# Patient Record
Sex: Male | Born: 1990 | Race: White | Hispanic: No | Marital: Single | Smoking: Current every day smoker
Health system: Southern US, Community
[De-identification: ages and names within clinical notes are randomized; demographics above are authoritative.]

## PROBLEM LIST (undated history)

## (undated) DIAGNOSIS — F32A Depression, unspecified: Secondary | ICD-10-CM

## (undated) DIAGNOSIS — F329 Major depressive disorder, single episode, unspecified: Secondary | ICD-10-CM

## (undated) HISTORY — PX: TONSILLECTOMY: SUR1361

## (undated) HISTORY — PX: WISDOM TOOTH EXTRACTION: SHX21

---

## 2017-12-27 ENCOUNTER — Encounter (HOSPITAL_COMMUNITY): Payer: Self-pay | Admitting: Emergency Medicine

## 2017-12-27 ENCOUNTER — Observation Stay (HOSPITAL_COMMUNITY)
Admission: EM | Admit: 2017-12-27 | Discharge: 2017-12-28 | Disposition: A | Discharge status: Home / Self Care | Payer: Self-pay | Attending: Internal Medicine | Admitting: Internal Medicine

## 2017-12-27 DIAGNOSIS — R55 Syncope and collapse: Secondary | ICD-10-CM | POA: Insufficient documentation

## 2017-12-27 DIAGNOSIS — Z720 Tobacco use: Secondary | ICD-10-CM | POA: Insufficient documentation

## 2017-12-27 DIAGNOSIS — I10 Essential (primary) hypertension: Secondary | ICD-10-CM | POA: Insufficient documentation

## 2017-12-27 DIAGNOSIS — E860 Dehydration: Secondary | ICD-10-CM | POA: Insufficient documentation

## 2017-12-27 DIAGNOSIS — F329 Major depressive disorder, single episode, unspecified: Secondary | ICD-10-CM | POA: Insufficient documentation

## 2017-12-27 DIAGNOSIS — R9431 Abnormal electrocardiogram [ECG] [EKG]: Secondary | ICD-10-CM | POA: Insufficient documentation

## 2017-12-27 DIAGNOSIS — Z79899 Other long term (current) drug therapy: Secondary | ICD-10-CM | POA: Insufficient documentation

## 2017-12-27 DIAGNOSIS — Z8249 Family history of ischemic heart disease and other diseases of the circulatory system: Secondary | ICD-10-CM | POA: Insufficient documentation

## 2017-12-27 DIAGNOSIS — N179 Acute kidney failure, unspecified: Principal | ICD-10-CM | POA: Insufficient documentation

## 2017-12-27 DIAGNOSIS — R000 Tachycardia, unspecified: Secondary | ICD-10-CM | POA: Insufficient documentation

## 2017-12-27 DIAGNOSIS — F419 Anxiety disorder, unspecified: Secondary | ICD-10-CM | POA: Insufficient documentation

## 2017-12-27 HISTORY — DX: Major depressive disorder, single episode, unspecified: F32.9

## 2017-12-27 HISTORY — DX: Depression, unspecified: F32.A

## 2017-12-27 LAB — BASIC METABOLIC PANEL
Anion gap: 18 — ABNORMAL HIGH (ref 5–15)
BUN: 24 mg/dL — ABNORMAL HIGH (ref 6–20)
CHLORIDE: 90 mmol/L — AB (ref 98–111)
CO2: 24 mmol/L (ref 22–32)
CREATININE: 3.34 mg/dL — AB (ref 0.61–1.24)
Calcium: 11 mg/dL — ABNORMAL HIGH (ref 8.9–10.3)
GFR calc non Af Amer: 24 mL/min — ABNORMAL LOW (ref >60)
GFR, EST AFRICAN AMERICAN: 27 mL/min — AB (ref >60)
Glucose, Bld: 93 mg/dL (ref 70–99)
Potassium: 3.6 mmol/L (ref 3.5–5.1)
Sodium: 132 mmol/L — ABNORMAL LOW (ref 135–145)

## 2017-12-27 LAB — URINALYSIS, ROUTINE W REFLEX MICROSCOPIC
Bilirubin Urine: NEGATIVE
GLUCOSE, UA: NEGATIVE mg/dL
Hgb urine dipstick: NEGATIVE
Ketones, ur: NEGATIVE mg/dL
LEUKOCYTES UA: NEGATIVE
Nitrite: NEGATIVE
Protein, ur: NEGATIVE mg/dL
SPECIFIC GRAVITY, URINE: 1.016 (ref 1.005–1.030)
pH: 5 (ref 5.0–8.0)

## 2017-12-27 LAB — RAPID URINE DRUG SCREEN, HOSP PERFORMED
AMPHETAMINES: NOT DETECTED
BENZODIAZEPINES: NOT DETECTED
Barbiturates: NOT DETECTED
Cocaine: NOT DETECTED
OPIATES: NOT DETECTED
TETRAHYDROCANNABINOL: POSITIVE — AB

## 2017-12-27 LAB — CBC
HEMATOCRIT: 51.5 % (ref 39.0–52.0)
Hemoglobin: 17.9 g/dL — ABNORMAL HIGH (ref 13.0–17.0)
MCH: 31.8 pg (ref 26.0–34.0)
MCHC: 34.8 g/dL (ref 30.0–36.0)
MCV: 91.5 fL (ref 80.0–100.0)
NRBC: 0 % (ref 0.0–0.2)
Platelets: 328 10*3/uL (ref 150–400)
RBC: 5.63 MIL/uL (ref 4.22–5.81)
RDW: 11.7 % (ref 11.5–15.5)
WBC: 13.7 10*3/uL — ABNORMAL HIGH (ref 4.0–10.5)

## 2017-12-27 LAB — D-DIMER, QUANTITATIVE: D-Dimer, Quant: <0.27 ug/mL-FEU (ref 0.00–0.50)

## 2017-12-27 LAB — I-STAT TROPONIN, ED: Troponin i, poc: 0 ng/mL (ref 0.00–0.08)

## 2017-12-27 MED ORDER — SODIUM CHLORIDE 0.9 % IV BOLUS
1000.0000 mL | Freq: Once | INTRAVENOUS | Status: AC
  Administered 2017-12-27: 1000 mL via INTRAVENOUS

## 2017-12-27 MED ORDER — POLYETHYLENE GLYCOL 3350 17 G PO PACK: 17.0000 g | PACK | Freq: Every day | ORAL | Status: DC | PRN

## 2017-12-27 MED ORDER — ACETAMINOPHEN 650 MG RE SUPP: 650.0000 mg | Freq: Four times a day (QID) | RECTAL | Status: DC | PRN

## 2017-12-27 MED ORDER — ACETAMINOPHEN 325 MG PO TABS
650.0000 mg | ORAL_TABLET | Freq: Four times a day (QID) | ORAL | Status: DC | PRN
  Administered 2017-12-28: 650 mg via ORAL
  Filled 2017-12-27: qty 2

## 2017-12-27 MED ORDER — SODIUM CHLORIDE 0.9 % IV SOLN
INTRAVENOUS | Status: DC
  Administered 2017-12-28: 02:00:00 via INTRAVENOUS

## 2017-12-27 MED ORDER — ENOXAPARIN SODIUM 40 MG/0.4ML ~~LOC~~ SOLN
40.0000 mg | Freq: Every day | SUBCUTANEOUS | Status: DC
  Administered 2017-12-28: 40 mg via SUBCUTANEOUS
  Filled 2017-12-27: qty 0.4

## 2017-12-27 NOTE — ED Provider Notes (Signed)
[facility] EMERGENCY DEPARTMENT Provider Note   CSN: [id number] Arrival date & time: 12/27/17  1913     History   Chief Complaint Chief Complaint  Patient presents with  . Dizziness  . Near Syncope    HPI Walter Madden is a 27 y.o. male.  26 year old male with a history of depression presents to the emergency department for evaluation of near syncope.  He states that he was at work at 1715 this evening when he started to experience blurry vision with flashing white light.  He states that he began to feel lightheaded and went pale.  Other bystanders at work noticed that he did not appear to be feeling well and assisted the patient to a seated position.  He states that he began experiencing palpitations as though his heart was beating faster.  He heard his heartbeat in his ears and developed a pain to his anterior superior right chest radiating to his bilateral shoulder blades and across his neck.  Notes some shortness of breath as well.  States that he felt "dazed" and slowed.  Symptoms lasted for approximately 45 minutes before spontaneously improving.  He states that he feels fatigued at this time.  Reports history of hypertension at his outpatient primary office.  Was supposed to follow-up for blood pressure recheck and potential initiation of antihypertensive medications, but neglected to go to this appointment.  Does have a strong family history of hypertension.  Notes that he experienced a similar episode 1.5 weeks ago where he took his blood pressure and it was elevated.  Took a metoprolol, belonging to his mother, at this time.  Has also been taking his father's losartan for the past 3 days.  Last use of losartan was at 1600 tonight.  Patient states that he remains well-hydrated during the day, drinking approximately 1.5 gallons of water.  He does have approximately 3-4 beers daily.  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  Denies any leg swelling, prolonged travel, recent surgeries or hospitalizations, syncope, fevers, recent URI symptoms, vomiting, hemoptysis.  No FHx of ACS or DVT/PE.     Past Medical History:  Diagnosis Date  . Depressed     There are no active problems to display for this patient.   Past Surgical History:  Procedure Laterality Date  . TONSILLECTOMY    . WISDOM TOOTH EXTRACTION          Home Medications    Prior to Admission medications   Not on File    Family History History reviewed. No pertinent family history.  Social History Social History   Tobacco Use  . Smoking status: Current Every Day Smoker    Packs/day: 0.50    Types: Cigarettes  Substance Use Topics  . Alcohol use: Yes    Comment: daily  . Drug use: Never     Allergies   Patient has no known allergies.   Review of Systems Review of Systems Ten systems reviewed and are negative for acute change, except as noted in the HPI.    Physical Exam Updated Vital Signs BP 120/83   Pulse 97   Temp 98.6 F (37 C) (Oral)   Resp 14   Ht 5\' 8"  (1.727 m)   Wt 72.6 kg   SpO2 100%   BMI 24.33 kg/m   Physical Exam  Constitutional: He is oriented to person, place, and time. He appears well-developed and well-nourished. No distress.  Nontoxic appearing and in NAD  HENT:  Head: Normocephalic and atraumatic.  Eyes: Conjunctivae and EOM are normal. No scleral icterus.  Neck: Normal range of motion.  Cardiovascular: Regular rhythm and intact distal pulses.  Tachycardia   Pulmonary/Chest: Effort normal. No stridor. No respiratory distress. He has no wheezes. He has no rales.  Respirations even and unlabored  Abdominal: Soft. He exhibits no distension and no mass. There is no tenderness. There is no guarding.  Soft, nontender abdomen  Musculoskeletal: Normal range of motion.  No BLE swelling  Neurological: He is alert and oriented to person, place, and time. He exhibits normal  muscle tone. Coordination normal.  Skin: Skin is warm and dry. No rash noted. He is not diaphoretic. No erythema. No pallor.  Psychiatric: He has a normal mood and affect. His behavior is normal.  Nursing note and vitals reviewed.    ED Treatments / Results  Labs (all labs ordered are listed, but only abnormal results are displayed) Labs Reviewed  BASIC METABOLIC PANEL - Abnormal; Notable for the following components:      Result Value   Sodium 132 (*)    Chloride 90 (*)    BUN 24 (*)    Creatinine, Ser 3.34 (*)    Calcium 11.0 (*)    GFR calc non Af Amer 24 (*)    GFR calc Af Amer 27 (*)    Anion gap 18 (*)    All other components within normal limits  CBC - Abnormal; Notable for the following components:   WBC 13.7 (*)    Hemoglobin 17.9 (*)    All other components within normal limits  URINALYSIS, ROUTINE W REFLEX MICROSCOPIC - Abnormal; Notable for the following components:   APPearance HAZY (*)    All other components within normal limits  RAPID URINE DRUG SCREEN, HOSP PERFORMED - Abnormal; Notable for the following components:   Tetrahydrocannabinol POSITIVE (*)    All other components within normal limits  D-DIMER, QUANTITATIVE (NOT AT [facility])  I-STAT TROPONIN, ED    EKG EKG Interpretation  Date/Time:  Saturday December 27 2017 19:17:48 EDT Ventricular Rate:  116 PR Interval:  116 QRS Duration: 86 QT Interval:  324 QTC Calculation: 450 R Axis:   71 Text Interpretation:  Sinus tachycardia Right atrial enlargement ST & T wave abnormality, consider inferior ischemia Abnormal ECG underlying tremor making interpretation difficult Confirmed by Meridee Score [id number]) on 12/27/2017 11:03:35 PM   Radiology No results found.  Procedures Procedures (including critical care time)  Medications Ordered in ED Medications  sodium chloride 0.9 % bolus 1,000 mL (has no administration in time range)  sodium chloride 0.9 % bolus 1,000 mL (1,000 mLs Intravenous New Bag/Given  12/27/17 2233)     Initial Impression / Assessment and Plan / ED Course  I have reviewed the triage vital signs and the nursing notes.  Pertinent labs & imaging results that were available during my care of the patient were reviewed by me and considered in my medical decision making (see chart for details).  Clinical Course as of Dec 27 2332  Sat Dec 27, 2017  [id number] 27 year old male here with ongoing dizziness lightheadedness.  And like he has been self treating with some medications.  His exam is benign along with fairly unremarkable vital signs.  Unfortunate with lab work he is got a new AKI.  He will need to be admitted to the hospital for further work-up of this.   [MB]    Clinical  Course User Index [MB] Terrilee Files, MD    27 year old male presents the emergency department for evaluation of near syncope.  He is noted to be tachycardic on arrival.  This has improved with IV fluids.  Suspect near syncope to be secondary to hypovolemia.  Dehydration also favored given creatinine of 3.34.  He will require admission for acute kidney injury and ongoing rehydration.  Case discussed with internal medicine residency service who will admit for unassigned.   Final Clinical Impressions(s) / ED Diagnoses   Final diagnoses:  Near syncope  AKI (acute kidney injury) [facility])    ED Discharge Orders    None       Antony Madura, PA-C 12/27/17 2341    Terrilee Files, MD 12/28/17 1024

## 2017-12-27 NOTE — ED Notes (Signed)
ED Provider at bedside. 

## 2017-12-27 NOTE — ED Triage Notes (Signed)
Pt presents with several episodes of near-syncope while at work, feeling lightheaded, seeing stars or like a cop has a flash light in his face; pt also reporting palpitations, sob; denies CP, headache or any other focal deficits currently; pts mom at bedside reporting that HTN runs in the family and that she has been giving him her BP medication.Walter Madden

## 2017-12-28 ENCOUNTER — Encounter (HOSPITAL_COMMUNITY): Payer: Self-pay | Admitting: Emergency Medicine

## 2017-12-28 ENCOUNTER — Other Ambulatory Visit: Payer: Self-pay

## 2017-12-28 DIAGNOSIS — Z720 Tobacco use: Secondary | ICD-10-CM

## 2017-12-28 DIAGNOSIS — F329 Major depressive disorder, single episode, unspecified: Secondary | ICD-10-CM

## 2017-12-28 DIAGNOSIS — F419 Anxiety disorder, unspecified: Secondary | ICD-10-CM

## 2017-12-28 DIAGNOSIS — R55 Syncope and collapse: Secondary | ICD-10-CM

## 2017-12-28 DIAGNOSIS — R7989 Other specified abnormal findings of blood chemistry: Secondary | ICD-10-CM

## 2017-12-28 DIAGNOSIS — R030 Elevated blood-pressure reading, without diagnosis of hypertension: Secondary | ICD-10-CM

## 2017-12-28 DIAGNOSIS — N179 Acute kidney failure, unspecified: Secondary | ICD-10-CM

## 2017-12-28 DIAGNOSIS — Z8249 Family history of ischemic heart disease and other diseases of the circulatory system: Secondary | ICD-10-CM

## 2017-12-28 DIAGNOSIS — E860 Dehydration: Secondary | ICD-10-CM

## 2017-12-28 DIAGNOSIS — E871 Hypo-osmolality and hyponatremia: Secondary | ICD-10-CM

## 2017-12-28 DIAGNOSIS — Z79899 Other long term (current) drug therapy: Secondary | ICD-10-CM

## 2017-12-28 LAB — CBC WITH DIFFERENTIAL/PLATELET
ABS IMMATURE GRANULOCYTES: 0.04 10*3/uL (ref 0.00–0.07)
BASOS ABS: 0 10*3/uL (ref 0.0–0.1)
Basophils Relative: 0 %
Eosinophils Absolute: 0 10*3/uL (ref 0.0–0.5)
Eosinophils Relative: 1 %
HCT: 45.7 % (ref 39.0–52.0)
HEMOGLOBIN: 16 g/dL (ref 13.0–17.0)
IMMATURE GRANULOCYTES: 1 %
LYMPHS PCT: 27 %
Lymphs Abs: 1.8 10*3/uL (ref 0.7–4.0)
MCH: 31.3 pg (ref 26.0–34.0)
MCHC: 35 g/dL (ref 30.0–36.0)
MCV: 89.3 fL (ref 80.0–100.0)
MONO ABS: 0.8 10*3/uL (ref 0.1–1.0)
Monocytes Relative: 12 %
NEUTROS ABS: 4 10*3/uL (ref 1.7–7.7)
NEUTROS PCT: 59 %
PLATELETS: 270 10*3/uL (ref 150–400)
RBC: 5.12 MIL/uL (ref 4.22–5.81)
RDW: 11.7 % (ref 11.5–15.5)
WBC: 6.7 10*3/uL (ref 4.0–10.5)
nRBC: 0 % (ref 0.0–0.2)

## 2017-12-28 LAB — BASIC METABOLIC PANEL
ANION GAP: 7 (ref 5–15)
BUN: 17 mg/dL (ref 6–20)
CO2: 29 mmol/L (ref 22–32)
Calcium: 9.7 mg/dL (ref 8.9–10.3)
Chloride: 99 mmol/L (ref 98–111)
Creatinine, Ser: 1.13 mg/dL (ref 0.61–1.24)
GFR calc Af Amer: >60 mL/min (ref >60)
Glucose, Bld: 104 mg/dL — ABNORMAL HIGH (ref 70–99)
POTASSIUM: 3.8 mmol/L (ref 3.5–5.1)
SODIUM: 135 mmol/L (ref 135–145)

## 2017-12-28 LAB — COMPREHENSIVE METABOLIC PANEL
ALBUMIN: 4.3 g/dL (ref 3.5–5.0)
ALT: 29 U/L (ref 0–44)
ANION GAP: 13 (ref 5–15)
AST: 30 U/L (ref 15–41)
Alkaline Phosphatase: 39 U/L (ref 38–126)
BUN: 24 mg/dL — AB (ref 6–20)
CHLORIDE: 98 mmol/L (ref 98–111)
CO2: 25 mmol/L (ref 22–32)
Calcium: 9.8 mg/dL (ref 8.9–10.3)
Creatinine, Ser: 2.01 mg/dL — ABNORMAL HIGH (ref 0.61–1.24)
GFR calc Af Amer: 50 mL/min — ABNORMAL LOW (ref >60)
GFR, EST NON AFRICAN AMERICAN: 43 mL/min — AB (ref >60)
Glucose, Bld: 126 mg/dL — ABNORMAL HIGH (ref 70–99)
POTASSIUM: 2.8 mmol/L — AB (ref 3.5–5.1)
Sodium: 136 mmol/L (ref 135–145)
Total Bilirubin: 0.8 mg/dL (ref 0.3–1.2)
Total Protein: 7.2 g/dL (ref 6.5–8.1)

## 2017-12-28 LAB — GLUCOSE, CAPILLARY: GLUCOSE-CAPILLARY: 102 mg/dL — AB (ref 70–99)

## 2017-12-28 LAB — TSH: TSH: 1.604 u[IU]/mL (ref 0.350–4.500)

## 2017-12-28 LAB — HIV ANTIBODY (ROUTINE TESTING W REFLEX): HIV SCREEN 4TH GENERATION: NONREACTIVE

## 2017-12-28 LAB — MAGNESIUM: Magnesium: 2.2 mg/dL (ref 1.7–2.4)

## 2017-12-28 MED ORDER — LACTATED RINGERS IV BOLUS
1000.0000 mL | Freq: Once | INTRAVENOUS | Status: AC
  Administered 2017-12-28: 1000 mL via INTRAVENOUS

## 2017-12-28 MED ORDER — POTASSIUM CHLORIDE CRYS ER 20 MEQ PO TBCR
40.0000 meq | EXTENDED_RELEASE_TABLET | ORAL | Status: DC
  Administered 2017-12-28: 40 meq via ORAL
  Filled 2017-12-28: qty 2

## 2017-12-28 MED ORDER — POTASSIUM CHLORIDE CRYS ER 20 MEQ PO TBCR: 40.0000 meq | EXTENDED_RELEASE_TABLET | Freq: Two times a day (BID) | ORAL | Status: DC

## 2017-12-28 NOTE — Progress Notes (Signed)
   Subjective: This morning Mr. Jeanty reports that he feels much better. He feels more lucid and his vision changes have resolved. He has been able to walk around without dizziness or feeling like he will faint. He acknowledges that he should not take any more of his parents' medications and that he should follow-up with his PCP about his blood pressures for management. He has no new symptoms or concerns today.   Objective:  Vital signs in last 24 hours: Vitals:   12/27/17 2245 12/27/17 2330 12/28/17 0038 12/28/17 0609  BP: 120/83 131/86 120/81 128/86  Pulse: 97 98 (!) 111 (!) 104  Resp: 14 16 15 12   Temp:      TempSrc:      SpO2: 100% 100% 100% 100%  Weight:      Height:       Physical Exam Constitutional: Well-developed, well-nourished, and in no distress. .  Cardiovascular: Normal rate and regular rhythm. No murmurs, rubs, or gallops. Pulmonary/Chest: Effort normal. Clear to auscultation bilaterally. No wheezes, rales, or rhonchi. Ext: No lower extremity edema. Skin: Warm and dry. No rashes or wounds.  Assessment/Plan:  Active Problems:   AKI (acute kidney injury) (HCC)   Near syncope  Mr. Kalman is a 27 yo male with a medical history of anxiety and depression who presented with near-syncope and prodromal symptoms of lightheadedness, spotted vision, palpitations, and chest tightness in the setting of taking his parents' blood pressure medications (thought to be losartan) for management of his self-diagnosed HTN. He was admitted for AKI and fluid resuscitation.   AKI - Cr elevated to 3.34 upon admission. Baseline is 0.8 one year ago. Likely prerenal AKI in the setting of dehydration and use of blood pressure medications. His AKI and dehydration explain his symptoms including presyncope and confusion - Received 2L of fluid with improvement of Cr to 2.01  - His symptoms also resolved with fluid resuscitation.  - Will check one more Cr. If Cr has downtrended, he will be  medically appropriate for discharge home.  Plan - Bolus 1L LR - Repeat pm BMP  Hypercalcemia:  - Ca elevated to 11 on admission. Improved to 9.8 after fluid resuscitation.  - If his parents' medication was losartan combined with hctz, the hctz could explain his hypercalcemia.  Plan - pm BMP - f/u ionized Ca  Hypertension - Patient has history of mildly elevated blood pressures at past PCP visits. He has not followed up for repeat measurements or treatment initiation. His blood pressures have been normal during the hospitalization. He will f/u with his PCP. Advised the patient that treatment for mild HTN in a young, healthy man such as himself is not emergent. He can safely wait for a PCP appointment without needing to take his parents' medications in the interim.  Plan - f/u with PCP   Dispo: Anticipated discharge today pending BMP improvements  [place]yer, Cathleen Corti, MD 12/28/2017, 6:59 AM Pager: [phone]

## 2017-12-28 NOTE — Discharge Summary (Signed)
Name: Walter Madden MRN: [id number] DOB: 1990/04/04 27 y.o. PCP: Family, [facility]  Date of Admission: 12/27/2017  7:15 PM Date of Discharge: 12/28/17 Attending Physician: Dr. Mikey Bussing  Discharge Diagnosis: 1. AKI 2. Presyncope 3. Hypercalcemia 4. Question of elevated blood pressure  Discharge Medications: Allergies as of 12/28/2017   No Known Allergies     Medication List    TAKE these medications   busPIRone 5 MG tablet Commonly known as:  BUSPAR Take 5 mg by mouth daily.       Disposition and follow-up:   Mr.Walter Madden was discharged from [facility] in Good condition.  At the hospital follow up visit please address:  1.  AKI: Likely prerenal from dehydration and blood pressure medication use. Cr improved from 3.34 on admission to 1.13 with fluid resuscitation.       Question of HTN: Patient was self-treating HTN after home measurements were elevated. Normotensive during admission. Repeat BP for formal diagnosis of HTN and further management.        Hypercalcemia: Improved from 11.0 on admission to 9.7 after fluid resuscitation. Likely in the setting of dehydration, AKI, and medication use. Ionized Ca pending at discharge.  2.  Labs / imaging needed at time of follow-up: None  3.  Pending labs/ test needing follow-up: Ionized Ca  Follow-up Appointments: Follow-up Information    Family, [facility] Follow up in 1 week(s).   Specialty:  Family Medicine Contact information: [facility] [address] [place] [address] [phone]           Hospital Course by problem list: Mr. Walter Madden is a 27 yo male with a medical history of anxiety and depression who presented with near-syncope and prodromal symptoms of lightheadedness, spotted vision, palpitations, and chest tightness in the setting of taking his parents' blood pressure medications for management of his  self-diagnosed HTN. He thought he took losartan, but is unsure. The medication could have included a diuretic like hctz. He was admitted for AKI and fluid resuscitation.   1. AKI: Cr was elevated to 3.34 upon admission. Baseline was 0.8 one year ago. Likely prerenal AKI in the setting of dehydration and use of blood pressure medications. His AKI and dehydration explained his symptoms including presyncope and confusion. He was treated with 3L of IVF. His Cr improved to 1.13 on discharge.  2. Presyncope: Caused by dehydration and AKI. Negative troponin and d-dimer to rule out ACS and PE.  3. Hypercalcemia: Ca elevated to 11 on admission. Improved to 9.7 upon discharge. Ionized Ca pending at time of discharge. Most likely caused by dehydration, AKI, and medication use.  4. Question of elevated blood pressure: Patient has history of mildly elevated blood pressures at past PCP visits. He has not followed up for repeat measurements or treatment initiation. His blood pressures were normal during the hospitalization. He was advised to avoid taking any blood pressure medications until he speaks with his PCP.   Discharge Vitals:   BP 128/86 (BP Location: Right Arm)   Pulse (!) 104   Temp 98.6 F (37 C) (Oral)   Resp 12   Ht 5\' 8"  (1.727 m)   Wt 72.6 kg   SpO2 100%   BMI 24.33 kg/m   Pertinent Labs, Studies, and Procedures:  CBC Latest Ref Rng & Units 12/28/2017 12/27/2017  WBC 4.0 - 10.5 K/uL 6.7 13.7(H)  Hemoglobin 13.0 - 17.0 g/dL 56.3 17.9(H)  Hematocrit 39.0 - 52.0 %  45.7 51.5  Platelets 150 - 400 K/uL 270 328   CMP Latest Ref Rng & Units 12/28/2017 12/28/2017 12/27/2017  Glucose 70 - 99 mg/dL 536(U) 440(H) 93  BUN 6 - 20 mg/dL 17 47(Q) 25(Z)  Creatinine 0.61 - 1.24 mg/dL 5.63 8.75(I) 4.33(I)  Sodium 135 - 145 mmol/L 135 136 132(L)  Potassium 3.5 - 5.1 mmol/L 3.8 2.8(L) 3.6  Chloride 98 - 111 mmol/L 99 98 90(L)  CO2 22 - 32 mmol/L 29 25 24   Calcium 8.9 - 10.3 mg/dL 9.7 9.8 11.0(H)    Total Protein 6.5 - 8.1 g/dL - 7.2 -  Total Bilirubin 0.3 - 1.2 mg/dL - 0.8 -  Alkaline Phos 38 - 126 U/L - 39 -  AST 15 - 41 U/L - 30 -  ALT 0 - 44 U/L - 29 -    Discharge Instructions: Discharge Instructions    Discharge instructions   Complete by:  As directed    It was a pleasure taking care of you, Mr. Walter Madden!  1. You were hospitalized for dehydration and kidney injury. This was most likely caused by not taking in enough water and taking blood pressure medications that were not prescribed to you. Your kidney injury and your symptoms have resolved and you are medically safe for discharge home. Please increase your water intake and avoid taking any blood pressure medications until you speak with your PCP.  2. Your kidney injury has completely resolved  2. Please make an appointment with your PCP to discuss your blood pressure. Your blood pressure was normal while you were in the hospital, but it has been elevated on previous visits with your PCP. In the meantime, there is no emergent need for high blood pressure treatment. Please see a doctor sooner if you have any episodes of chest pain or fainting.  3. Your calcium was mildly elevated. The calcium improved to normal after you received fluids. We have one blood work lab about calcium that has not resulted yet. We will call you if the results are abnormal.   Feel free to call our clinic at [phone] if you have any questions!  Thanks, Dr. Avie Arenas      Signed: Dorrell, Cathleen Corti, MD 12/28/2017, 12:46 PM   Pager: [phone]

## 2017-12-28 NOTE — H&P (Signed)
Date: 12/28/2017               Patient Name:  Walter Madden MRN: [id number]  DOB: [birth year] Age / Sex: 27 y.o., male   PCP: Family, [facility]         Medical Service: Internal Medicine Teaching Service         Attending Physician: Dr. Mikey Bussing, Marthenia Rolling, DO    First Contact: Dr. Avie Arenas Pager:  [phone]  Second Contact: Dr. Mikey Bussing  Pager: [phone]       After Hours (After 5p/  First Contact Pager: [phone]  weekends / holidays): Second Contact Pager: [phone]   Chief Complaint: pre-syncope   History of Present Illness: Walter Madden is a 27 y/o male with PMHx of anxiety and depression who presents to the ED after a near-syncopal episode at work. Around 5:15 this afternoon he began having pro-dromal symptoms of lightheadedness, spotted vision, palpitations and chest tightness that radiated into his neck and back. He never lost consciousness. Patient called his sister to pick him up from work. His parents then brought him to the ED after symptoms had not fully resolved. Episode lasted for approximately 45 minutes.  He notes a similar episode 1.5 weeks ago in which he developed lightheadedness and palpitations. At that time, he took his blood pressure and it was elevated at 150/110. He took one of his mom's Metoprolol that she takes for HTN. Three days ago, he starting taking his dad's anti-hypertensive which family thinks is Losartan 20 mg.  Patient was feeling well until yesterday when he felt lightheaded again. This morning around 4 am he had an episode of non-bloody non-bilious vomiting. Also endorses bilateral hand cramping that began yesterday.  No recent, fevers, chills, abdominal pain, diarrhea, URI symptoms, chest pain, shortness of breath, urinary symptoms, hematuria.   ED work-up: D-dimer and troponin negative; U/A normal; BMP revealed mild hyponatremia and hypochloremia, BUN 24, crt 3.34, calcium 11   Meds: Patient has been on Paxil and Buspirone for anxiety and  depression but does not recall dose No outpatient medications have been marked as taking for the 12/27/17 encounter [facility] Encounter).     Allergies: Allergies as of 12/27/2017  . (No Known Allergies)   Past Medical History:  Diagnosis Date  . Depressed     Family History: + for HTN (mother and father); negative for MI, clotting disorders, kidney disease   Social History: works as Biomedical scientist at [employer]; currently living with his parents; eats fairly healthy and exercises frequently; endorses prior history of EtOH and cocaine abuse but currently drinks 2-3 beers/day (last drink 2 nights ago); occasionally smokes tobacco and/or marijuana   Review of Systems: A complete ROS was negative except as per HPI.   Physical Exam: Blood pressure 120/81, pulse (!) 111, temperature 98.6 F (37 C), temperature source Oral, resp. rate 15, height 5\' 8"  (1.727 m), weight 72.6 kg, SpO2 100 %. General: A&Ox4; pleasant male, sitting up in bed in NAD HEENT: normocephalic, atraumatic, mucous membranes mildly dry Neck: supple CV: Tachycardic rate, regular rhythm; no murmurs, rubs or gallops Pulm: normal respiratory effort; lungs CTA bilaterally Abd: BS+; abdomen soft, non-distended, non-tender MSK: TTP bilateral mid-thoracic paraspinal muscles  Ext: no edema Skin: warm and dry; superficial burn to left forearm Psych: appropriate mood and affect   EKG: personally reviewed my interpretation is sinus tachycardia, otherwise difficult to interpret due to artifact   Assessment & Plan by Problem: Active Problems:   AKI (  acute kidney injury) (HCC)   Near syncope  1. AKI: likely prerenal that is multifactorial in the setting of recent ARB use (possibly in combination with diuretic), dehydration due to episode of emesis and fairly hot and strenuous work environment.  - s/p 2L fluid boluses - will continue IVF at 250 cc/hr x10 hours - CMP in the morning  2. Pre-syncope: may be in the setting  of hypovolemia. Low suspicion for PE given negative d-dimer, and he denies chest pain, shortness of breath, leg pain or swelling. Will monitor for arrhythmia on telemetry and repeat EKG in the morning.   3. Hypercalcemia: most likely in the setting of AKI. Patient is symptomatic with muscle spasms. Continuing IVF as above and repeating labs with ionized calcium. Since he is persistently tachycardic, will also check TSH to rule out hyperthyroidism as etiology for elevated calcium.  If his father's Losartan is in combination with HCTZ, this would also explain his lab findings. His father will confirm which medication he is on.   DVT prophylaxis: Lovenox  Diet: regular  Code: FULL   Dispo: Admit patient to Observation with expected length of stay less than 2 midnights.  SignedBridget Hartshorn, DO 12/28/2017, 1:11 AM  Pager: [phone]

## 2017-12-29 LAB — CALCIUM, IONIZED: Calcium, Ionized, Serum: 5.1 mg/dL (ref 4.5–5.6)

## 2018-01-23 ENCOUNTER — Encounter (HOSPITAL_BASED_OUTPATIENT_CLINIC_OR_DEPARTMENT_OTHER): Payer: Self-pay

## 2018-01-23 ENCOUNTER — Emergency Department (HOSPITAL_BASED_OUTPATIENT_CLINIC_OR_DEPARTMENT_OTHER)
Admission: EM | Admit: 2018-01-23 | Discharge: 2018-01-23 | Disposition: A | Discharge status: Home / Self Care | Payer: Self-pay | Attending: Emergency Medicine | Admitting: Emergency Medicine

## 2018-01-23 ENCOUNTER — Other Ambulatory Visit: Payer: Self-pay

## 2018-01-23 ENCOUNTER — Emergency Department (HOSPITAL_BASED_OUTPATIENT_CLINIC_OR_DEPARTMENT_OTHER): Payer: Self-pay

## 2018-01-23 DIAGNOSIS — Z79899 Other long term (current) drug therapy: Secondary | ICD-10-CM | POA: Insufficient documentation

## 2018-01-23 DIAGNOSIS — R0789 Other chest pain: Secondary | ICD-10-CM | POA: Insufficient documentation

## 2018-01-23 DIAGNOSIS — F17210 Nicotine dependence, cigarettes, uncomplicated: Secondary | ICD-10-CM | POA: Insufficient documentation

## 2018-01-23 DIAGNOSIS — R030 Elevated blood-pressure reading, without diagnosis of hypertension: Secondary | ICD-10-CM | POA: Insufficient documentation

## 2018-01-23 LAB — HEPATIC FUNCTION PANEL
ALBUMIN: 4.3 g/dL (ref 3.5–5.0)
ALK PHOS: 34 U/L — AB (ref 38–126)
ALT: 40 U/L (ref 0–44)
AST: 28 U/L (ref 15–41)
BILIRUBIN INDIRECT: 0.6 mg/dL (ref 0.3–0.9)
BILIRUBIN TOTAL: 0.7 mg/dL (ref 0.3–1.2)
Bilirubin, Direct: 0.1 mg/dL (ref 0.0–0.2)
TOTAL PROTEIN: 7.1 g/dL (ref 6.5–8.1)

## 2018-01-23 LAB — CBC WITH DIFFERENTIAL/PLATELET
Abs Immature Granulocytes: 0.01 10*3/uL (ref 0.00–0.07)
BASOS ABS: 0 10*3/uL (ref 0.0–0.1)
Basophils Relative: 0 %
EOS PCT: 1 %
Eosinophils Absolute: 0 10*3/uL (ref 0.0–0.5)
HCT: 46.6 % (ref 39.0–52.0)
HEMOGLOBIN: 15.7 g/dL (ref 13.0–17.0)
Immature Granulocytes: 0 %
LYMPHS PCT: 21 %
Lymphs Abs: 1.1 10*3/uL (ref 0.7–4.0)
MCH: 31.8 pg (ref 26.0–34.0)
MCHC: 33.7 g/dL (ref 30.0–36.0)
MCV: 94.3 fL (ref 80.0–100.0)
MONOS PCT: 11 %
Monocytes Absolute: 0.6 10*3/uL (ref 0.1–1.0)
NRBC: 0 % (ref 0.0–0.2)
Neutro Abs: 3.3 10*3/uL (ref 1.7–7.7)
Neutrophils Relative %: 67 %
Platelets: 251 10*3/uL (ref 150–400)
RBC: 4.94 MIL/uL (ref 4.22–5.81)
RDW: 11.9 % (ref 11.5–15.5)
WBC: 5 10*3/uL (ref 4.0–10.5)

## 2018-01-23 LAB — BASIC METABOLIC PANEL
Anion gap: 9 (ref 5–15)
BUN: 11 mg/dL (ref 6–20)
CALCIUM: 9.2 mg/dL (ref 8.9–10.3)
CHLORIDE: 101 mmol/L (ref 98–111)
CO2: 26 mmol/L (ref 22–32)
CREATININE: 0.82 mg/dL (ref 0.61–1.24)
GFR calc non Af Amer: >60 mL/min (ref >60)
GLUCOSE: 111 mg/dL — AB (ref 70–99)
Potassium: 4.3 mmol/L (ref 3.5–5.1)
Sodium: 136 mmol/L (ref 135–145)

## 2018-01-23 LAB — LIPASE, BLOOD: Lipase: 30 U/L (ref 11–51)

## 2018-01-23 LAB — TROPONIN I: Troponin I: <0.03 ng/mL (ref <0.03)

## 2018-01-23 MED ORDER — KETOROLAC TROMETHAMINE 30 MG/ML IJ SOLN
30.0000 mg | Freq: Once | INTRAMUSCULAR | Status: AC
  Administered 2018-01-23: 30 mg via INTRAVENOUS
  Filled 2018-01-23: qty 1

## 2018-01-23 NOTE — ED Provider Notes (Signed)
[facility] EMERGENCY DEPARTMENT Provider Note   CSN: [id number] Arrival date & time: 01/23/18  1716     History   Chief Complaint Chief Complaint  Patient presents with  . Chest Pain    HPI Walter Madden is a 27 y.o. male who presents to ED for evaluation of 5-day history of constant, dull pressure-like sensation in the middle of his chest.  Denies any shortness of breath or URI symptoms.  Patient states that his pain began 5 days ago.  He has been checking his blood pressures at home and they have been elevated to the 1 30-1 40 systolic range.  However, he states that he checked his blood pressure this morning and found it to be 160s systolic.  He does note that about 4 weeks ago, he was hospitalized for an AKI after taking several doses of his father's blood pressure and diuretic medication.  He has never been diagnosed with hypertension and has never been on any prescribed antihypertensives.  He has not tried any medications at home to help with his symptoms.  He denies any nausea, vomiting, abdominal pain, fever.  He reports daily alcohol use and occasional tobacco use.  Denies any other drug use.  Denies any leg swelling, recent immobilization, history of MI, DVT or PE.  HPI  Past Medical History:  Diagnosis Date  . Depressed     Patient Active Problem List   Diagnosis Date Noted  . Near syncope   . AKI (acute kidney injury) (HCC) 12/27/2017    Past Surgical History:  Procedure Laterality Date  . TONSILLECTOMY    . WISDOM TOOTH EXTRACTION          Home Medications    Prior to Admission medications   Medication Sig Start Date End Date Taking? Authorizing Provider  busPIRone (BUSPAR) 5 MG tablet Take 5 mg by mouth daily. 08/29/17 08/29/18  [provider]    Family History No family history on file.  Social History Social History   Tobacco Use  . Smoking status: Current Every Day Smoker    Packs/day: 0.50    Types: Cigarettes  .  Smokeless tobacco: Never Used  Substance Use Topics  . Alcohol use: Yes    Comment: daily  . Drug use: Never     Allergies   Patient has no known allergies.   Review of Systems Review of Systems  Constitutional: Negative for appetite change, chills and fever.  HENT: Negative for ear pain, rhinorrhea, sneezing and sore throat.   Eyes: Negative for photophobia and visual disturbance.  Respiratory: Negative for cough, chest tightness, shortness of breath and wheezing.   Cardiovascular: Positive for chest pain. Negative for palpitations.  Gastrointestinal: Negative for abdominal pain, blood in stool, constipation, diarrhea, nausea and vomiting.  Genitourinary: Negative for dysuria, hematuria and urgency.  Musculoskeletal: Negative for myalgias.  Skin: Negative for rash.  Neurological: Negative for dizziness, weakness and light-headedness.     Physical Exam Updated Vital Signs BP (!) 140/98   Pulse 87   Temp 97.9 F (36.6 C) (Oral)   Resp 16   Ht 5\' 7"  (1.702 m)   Wt 75.3 kg   SpO2 100%   BMI 26.00 kg/m   Physical Exam  Constitutional: He appears well-developed and well-nourished. No distress.  HENT:  Head: Normocephalic and atraumatic.  Nose: Nose normal.  Eyes: Conjunctivae and EOM are normal. Left eye exhibits no discharge. No scleral icterus.  Neck: Normal range of motion. Neck supple.  Cardiovascular:  Normal rate, regular rhythm, normal heart sounds and intact distal pulses. Exam reveals no gallop and no friction rub.  No murmur heard. Pulmonary/Chest: Effort normal and breath sounds normal. No respiratory distress.  Abdominal: Soft. Bowel sounds are normal. He exhibits no distension. There is no tenderness. There is no guarding.  Musculoskeletal: Normal range of motion. He exhibits no edema.  No lower extremity edema, erythema or calf tenderness bilaterally. 2+ DP pulses bilaterally.  Neurological: He is alert. He exhibits normal muscle tone. Coordination normal.   Skin: Skin is warm and dry. No rash noted.  Psychiatric: He has a normal mood and affect.  Nursing note and vitals reviewed.    ED Treatments / Results  Labs (all labs ordered are listed, but only abnormal results are displayed) Labs Reviewed  BASIC METABOLIC PANEL - Abnormal; Notable for the following components:      Result Value   Glucose, Bld 111 (*)    All other components within normal limits  HEPATIC FUNCTION PANEL - Abnormal; Notable for the following components:   Alkaline Phosphatase 34 (*)    All other components within normal limits  CBC WITH DIFFERENTIAL/PLATELET  TROPONIN I  LIPASE, BLOOD    EKG None  Radiology Dg Chest 2 View  Result Date: 01/23/2018 CLINICAL DATA:  Chest pain EXAM: CHEST - 2 VIEW COMPARISON:  None. FINDINGS: The heart size and mediastinal contours are within normal limits. Both lungs are clear. The visualized skeletal structures are unremarkable. IMPRESSION: No active cardiopulmonary disease. Electronically Signed   By: Jasmine Pang M.D.   On: 01/23/2018 18:30    Procedures Procedures (including critical care time)  Medications Ordered in ED Medications  ketorolac (TORADOL) 30 MG/ML injection 30 mg (30 mg Intravenous Given 01/23/18 1801)     Initial Impression / Assessment and Plan / ED Course  I have reviewed the triage vital signs and the nursing notes.  Pertinent labs & imaging results that were available during my care of the patient were reviewed by me and considered in my medical decision making (see chart for details).  Clinical Course as of Jan 23 1899  Fri Jan 23, 2018  1828 From PCP note on 01/05/2018  Patient reports that for weeks he has been taking his father's blood pressure which is losartan-HCTZ 100-25 mg tablets. Patient reports that at home he was noting blood pressures that were climbing between 140s to 150s. He reports that he did not follow-up in the office as he lost insurance. Patient states that he took the  blood pressure medication and began to notice a tunneling of his vision. He states that he was not feeling well and went to the emergency department. In the emergency department he was noted to be experiencing an acute kidney injury.   [HK]  1856 Recheck BP 140/98.   [HK]    Clinical Course User Index [HK] Dietrich Pates, PA-C    27 year old male presents to ED for 5-day history of constant, dull pressure-like sensation in the middle of his chest.  Patient has been checking blood pressures at home since his pain began.  States that they have been elevated to 1 30-1 40 systolic.  He checked his blood pressure prior to arrival when his symptoms worsen was found to be in the 160 systolic.  Patient has been told in the past that he had high blood pressure readings in the office but has never been prescribed any antihypertensives.  Of note, several weeks ago patient was hospitalized after  taking several doses of his father's losartan-HCTZ tablets and found to have an AKI.  Since then, he has followed up with his primary care provider but has not yet been placed on antihypertensive regimen.  There is no lower extremity edema, erythema or calf tenderness bilaterally.  No history of MI, DVT or PE.  CBC, CMP, lipase, troponin unremarkable.  EKG shows sinus rhythm.  Patient given Toradol with improvement in his symptoms.  I had a discussion with the patient regarding his blood pressure.  He has had elevated blood pressure readings of 130-140/90 systolic at 2 prior office visits.  This is his blood pressure at discharge as well. He states that "I think I'm a hypochondriac, I'm always worked up or high strung about something, that's just me." Informed patient that he will need to follow-up with his primary care provider, continue checking blood pressures at home, decrease salt intake and increase exercise, as well as managing stress, as these could all be contributing to his elevated BP. I did offer patient beginning  low-dose antihypertensive here.  However, he declines.  Suspect that his symptoms are musculoskeletal in nature. Doubt PE as he is PERC negative.  He is low risk by heart score. Doubt dissection as patient has intact, symmetrical distal pulses. Patient's blood pressure initially elevated in the emergency department today. Patient denies headache, change in vision, numbness, weakness, dyspnea, dizziness, or lightheadedness therefore doubt hypertensive emergency. Discussed elevated blood pressure with the patient and the need for primary care follow up with potential need to initiate or change antihypertensive medications and or for further evaluation. Discussed return precaution signs/symptoms for hypertensive emergency as listed above with the patient.   Patient is hemodynamically stable, in NAD, and able to ambulate in the ED. Evaluation does not show pathology that would require ongoing emergent intervention or inpatient treatment. I explained the diagnosis to the patient. Pain has been managed and has no complaints prior to discharge. Patient is comfortable with above plan and is stable for discharge at this time. All questions were answered prior to disposition. Strict return precautions for returning to the ED were discussed. Encouraged follow up with PCP.    Portions of this note were generated with Scientist, clinical (histocompatibility and immunogenetics). Dictation errors may occur despite best attempts at proofreading.  Final Clinical Impressions(s) / ED Diagnoses   Final diagnoses:  Chest wall pain  Elevated blood pressure reading without diagnosis of hypertension    ED Discharge Orders    None       Dietrich Pates, PA-C 01/23/18 1900    Benjiman Core, MD 01/23/18 2333

## 2018-01-23 NOTE — ED Triage Notes (Signed)
C/o CP x 5 days-NAD-steady gait 

## 2018-01-23 NOTE — Discharge Instructions (Signed)
He can alternate Tylenol and ibuprofen to help with your pain. Please speak to your primary care provider regarding your elevated blood pressure so they can determine whether you need to be started on the medication. In the meantime, decrease your salt intake and increase your exercise. Return to ED for worsening pain, chest pain, shortness of breath, leg swelling, lightheadedness, loss of consciousness.

## 2019-01-02 ENCOUNTER — Emergency Department (HOSPITAL_BASED_OUTPATIENT_CLINIC_OR_DEPARTMENT_OTHER)
Admission: EM | Admit: 2019-01-02 | Discharge: 2019-01-02 | Disposition: A | Discharge status: Home / Self Care | Payer: Self-pay | Attending: Emergency Medicine | Admitting: Emergency Medicine

## 2019-01-02 ENCOUNTER — Other Ambulatory Visit: Payer: Self-pay

## 2019-01-02 ENCOUNTER — Emergency Department (HOSPITAL_BASED_OUTPATIENT_CLINIC_OR_DEPARTMENT_OTHER): Payer: Self-pay

## 2019-01-02 ENCOUNTER — Encounter (HOSPITAL_BASED_OUTPATIENT_CLINIC_OR_DEPARTMENT_OTHER): Payer: Self-pay | Admitting: Adult Health

## 2019-01-02 DIAGNOSIS — F17210 Nicotine dependence, cigarettes, uncomplicated: Secondary | ICD-10-CM | POA: Insufficient documentation

## 2019-01-02 DIAGNOSIS — R0602 Shortness of breath: Secondary | ICD-10-CM | POA: Insufficient documentation

## 2019-01-02 DIAGNOSIS — R0789 Other chest pain: Secondary | ICD-10-CM | POA: Insufficient documentation

## 2019-01-02 LAB — COMPREHENSIVE METABOLIC PANEL
ALT: 49 U/L — ABNORMAL HIGH (ref 0–44)
AST: 37 U/L (ref 15–41)
Albumin: 4.7 g/dL (ref 3.5–5.0)
Alkaline Phosphatase: 47 U/L (ref 38–126)
Anion gap: 12 (ref 5–15)
BUN: 10 mg/dL (ref 6–20)
CO2: 19 mmol/L — ABNORMAL LOW (ref 22–32)
Calcium: 9.4 mg/dL (ref 8.9–10.3)
Chloride: 104 mmol/L (ref 98–111)
Creatinine, Ser: 0.68 mg/dL (ref 0.61–1.24)
GFR calc Af Amer: >60 mL/min (ref >60)
GFR calc non Af Amer: >60 mL/min (ref >60)
Glucose, Bld: 92 mg/dL (ref 70–99)
Potassium: 3.5 mmol/L (ref 3.5–5.1)
Sodium: 135 mmol/L (ref 135–145)
Total Bilirubin: 0.8 mg/dL (ref 0.3–1.2)
Total Protein: 7.6 g/dL (ref 6.5–8.1)

## 2019-01-02 LAB — CBC WITH DIFFERENTIAL/PLATELET
Abs Immature Granulocytes: 0.02 10*3/uL (ref 0.00–0.07)
Basophils Absolute: 0 10*3/uL (ref 0.0–0.1)
Basophils Relative: 0 %
Eosinophils Absolute: 0 10*3/uL (ref 0.0–0.5)
Eosinophils Relative: 1 %
HCT: 44.8 % (ref 39.0–52.0)
Hemoglobin: 15.6 g/dL (ref 13.0–17.0)
Immature Granulocytes: 0 %
Lymphocytes Relative: 26 %
Lymphs Abs: 1.9 10*3/uL (ref 0.7–4.0)
MCH: 31.8 pg (ref 26.0–34.0)
MCHC: 34.8 g/dL (ref 30.0–36.0)
MCV: 91.4 fL (ref 80.0–100.0)
Monocytes Absolute: 0.7 10*3/uL (ref 0.1–1.0)
Monocytes Relative: 9 %
Neutro Abs: 4.8 10*3/uL (ref 1.7–7.7)
Neutrophils Relative %: 64 %
Platelets: 276 10*3/uL (ref 150–400)
RBC: 4.9 MIL/uL (ref 4.22–5.81)
RDW: 11.5 % (ref 11.5–15.5)
WBC: 7.5 10*3/uL (ref 4.0–10.5)
nRBC: 0 % (ref 0.0–0.2)

## 2019-01-02 LAB — D-DIMER, QUANTITATIVE (NOT AT ARMC): D-Dimer, Quant: <0.27 ug/mL-FEU (ref 0.00–0.50)

## 2019-01-02 LAB — TROPONIN I (HIGH SENSITIVITY): Troponin I (High Sensitivity): 3 ng/L (ref <18)

## 2019-01-02 LAB — LIPASE, BLOOD: Lipase: 38 U/L (ref 11–51)

## 2019-01-02 MED ORDER — LISINOPRIL 10 MG PO TABS
10.0000 mg | ORAL_TABLET | Freq: Once | ORAL | Status: DC
  Filled 2019-01-02: qty 1

## 2019-01-02 MED ORDER — ESOMEPRAZOLE MAGNESIUM 40 MG PO CPDR: 40.0000 mg | DELAYED_RELEASE_CAPSULE | Freq: Every day | ORAL | 0 refills | Status: AC

## 2019-01-02 NOTE — ED Provider Notes (Signed)
[facility] EMERGENCY DEPARTMENT Provider Note   CSN: [id number] Arrival date & time: 01/02/19  2004     History   Chief Complaint Chief Complaint  Patient presents with  . Chest Pain    HPI Walter Madden is a 28 y.o. male hx of HTN, here presenting with chest pain.  Patient states that he has substernal chest pain and epigastric pain for the last 2 days.  States that he has some subjective shortness of breath as well.  Patient states that he is working 3 jobs and just got married and is under a lot of stress.  Denies any recent travel or history of blood clots.  Of note, patient was on blood pressure medicines before but has not been taking them.  He states that he has family history of CAD and hypertension but no personal history of MI.      The history is provided by the patient.    Past Medical History:  Diagnosis Date  . Depressed     Patient Active Problem List   Diagnosis Date Noted  . Near syncope   . AKI (acute kidney injury) (HCC) 12/27/2017    Past Surgical History:  Procedure Laterality Date  . TONSILLECTOMY    . WISDOM TOOTH EXTRACTION          Home Medications    Prior to Admission medications   Not on File    Family History History reviewed. No pertinent family history.  Social History Social History   Tobacco Use  . Smoking status: Current Every Day Smoker    Packs/day: 0.50    Types: Cigarettes  . Smokeless tobacco: Never Used  Substance Use Topics  . Alcohol use: Yes    Comment: daily  . Drug use: Never     Allergies   Patient has no known allergies.   Review of Systems Review of Systems  Cardiovascular: Positive for chest pain.  All other systems reviewed and are negative.    Physical Exam Updated Vital Signs BP (!) 152/95   Pulse (!) 102   Temp 97.8 F (36.6 C) (Oral)   Resp 18   Ht 5\' 7"  (1.702 m)   Wt 73.9 kg   SpO2 99%   BMI 25.53 kg/m   Physical Exam Vitals signs and nursing note  reviewed.  HENT:     Head: Normocephalic.  Eyes:     Extraocular Movements: Extraocular movements intact.     Pupils: Pupils are equal, round, and reactive to light.  Neck:     Musculoskeletal: Normal range of motion and neck supple.  Cardiovascular:     Rate and Rhythm: Normal rate and regular rhythm.     Heart sounds: Normal heart sounds.  Pulmonary:     Effort: Pulmonary effort is normal.     Breath sounds: Normal breath sounds.     Comments: No reproducible tenderness  Abdominal:     General: Bowel sounds are normal.     Palpations: Abdomen is soft.  Musculoskeletal: Normal range of motion.     Comments: No calf tenderness   Skin:    General: Skin is warm.     Capillary Refill: Capillary refill takes less than 2 seconds.  Neurological:     General: No focal deficit present.     Mental Status: He is alert and oriented to person, place, and time.  Psychiatric:        Mood and Affect: Mood normal.  Behavior: Behavior normal.      ED Treatments / Results  Labs (all labs ordered are listed, but only abnormal results are displayed) Labs Reviewed  CBC WITH DIFFERENTIAL/PLATELET  COMPREHENSIVE METABOLIC PANEL  LIPASE, BLOOD  D-DIMER, QUANTITATIVE (NOT AT [facility])  TROPONIN I (HIGH SENSITIVITY)    EKG None  Radiology No results found.  Procedures Procedures (including critical care time)  Medications Ordered in ED Medications - No data to display   Initial Impression / Assessment and Plan / ED Course  I have reviewed the triage vital signs and the nursing notes.  Pertinent labs & imaging results that were available during my care of the patient were reviewed by me and considered in my medical decision making (see chart for details).       Walter Madden is a 28 y.o. male here with chest pain, SOB. HR is low 100s. Consider PE so will get d-dimer. Pain for several days so trop x 1 sufficient. Likely more reflux  10:16 PM Labs including trop and  d-dimer negative. Will dc home with nexium.    Final Clinical Impressions(s) / ED Diagnoses   Final diagnoses:  None    ED Discharge Orders    None       Drenda Freeze, MD 01/02/19 2217

## 2019-01-02 NOTE — Discharge Instructions (Signed)
Take nexium daily   See your doctor  Return to ER if you have worse chest pain, shortness of breath

## 2019-01-02 NOTE — ED Triage Notes (Signed)
Presents with central chest pain that began 3 days ago and is constant. 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 He desribes the pain as pressure and endorses nausea. Denies SOB.

## 2019-08-06 IMAGING — CR DG CHEST 2V
2 series · 2 of 2 positions shown · non-contrast
Comparison: None.

CLINICAL DATA: Chest pain

EXAM:
CHEST - 2 VIEW

[w chest pa]
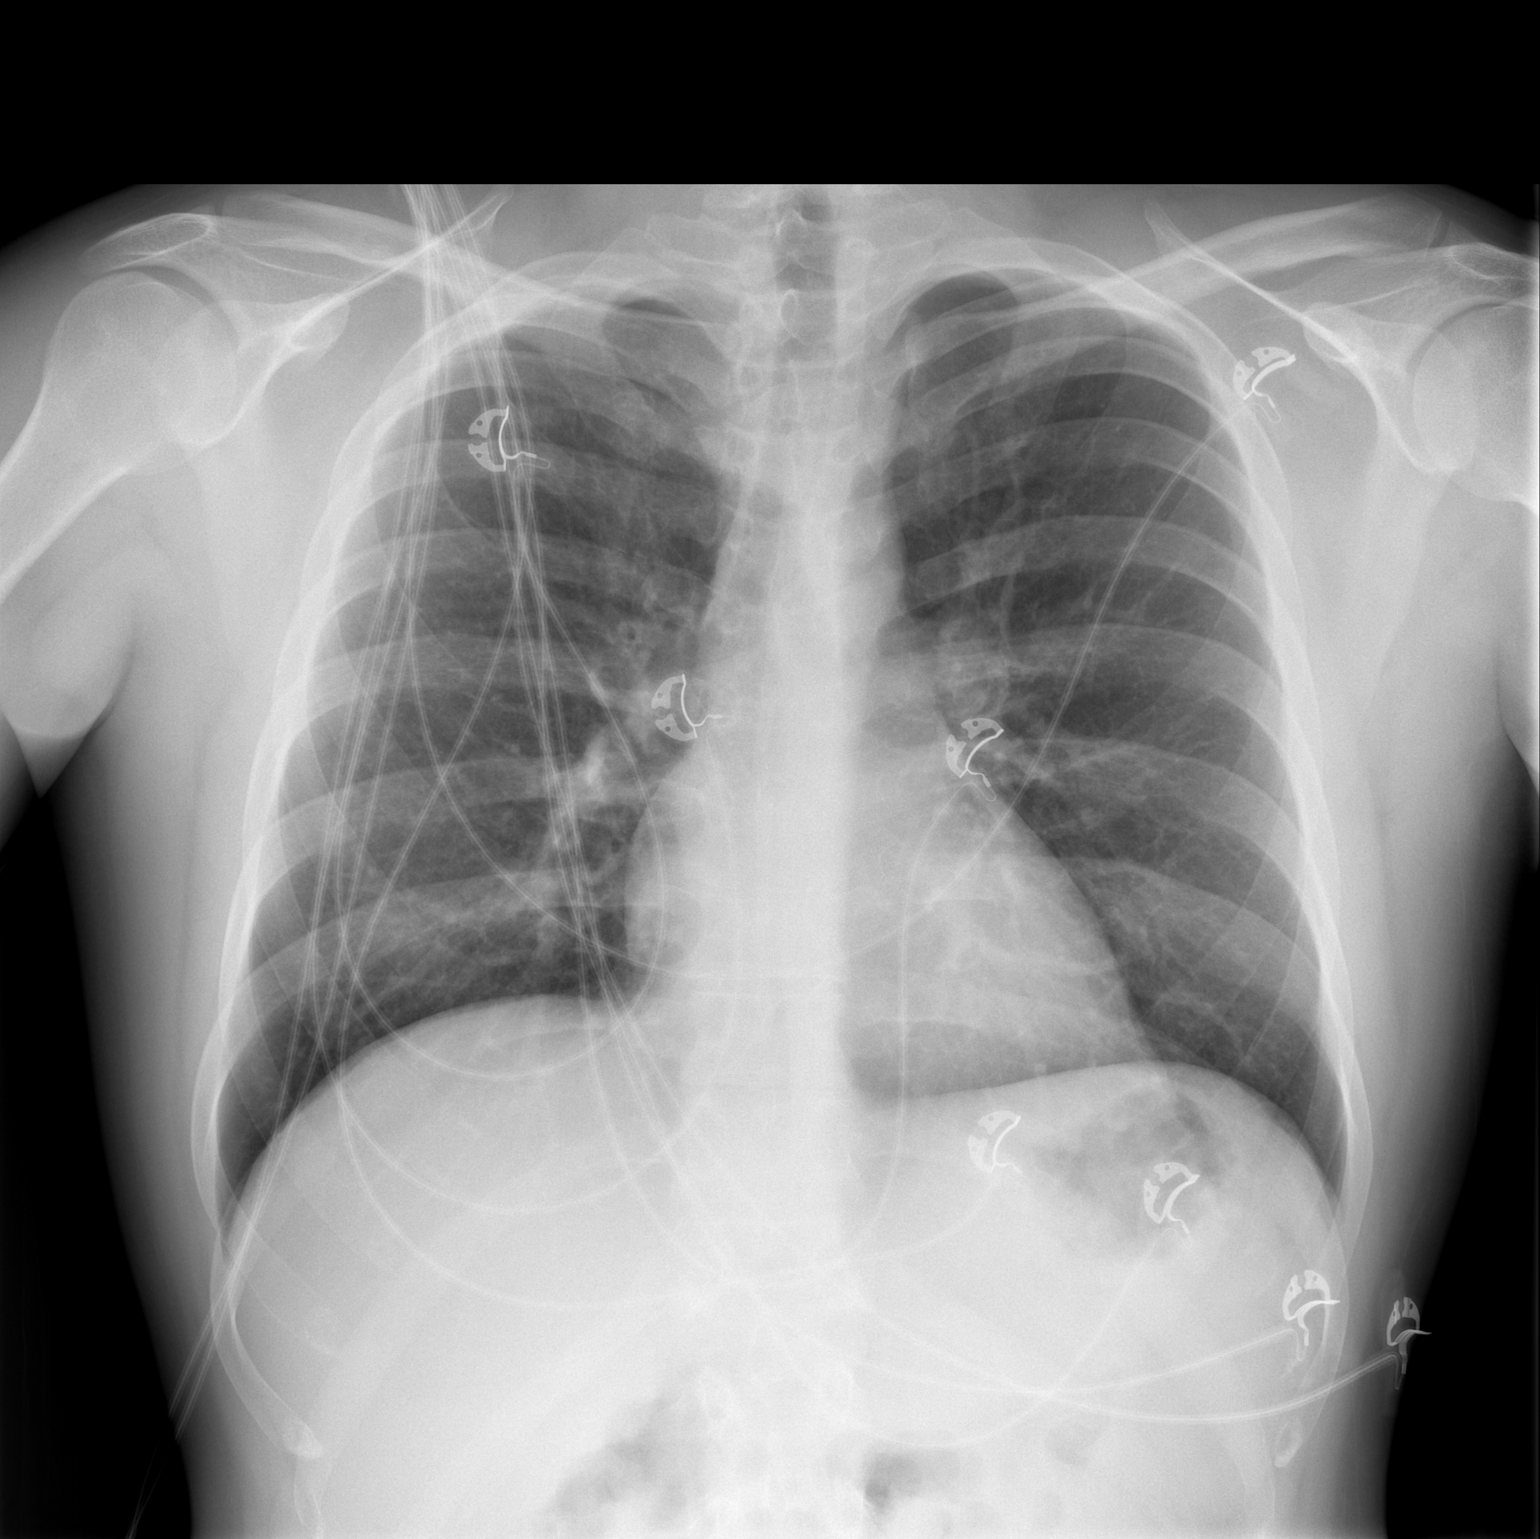

[w chest lat]
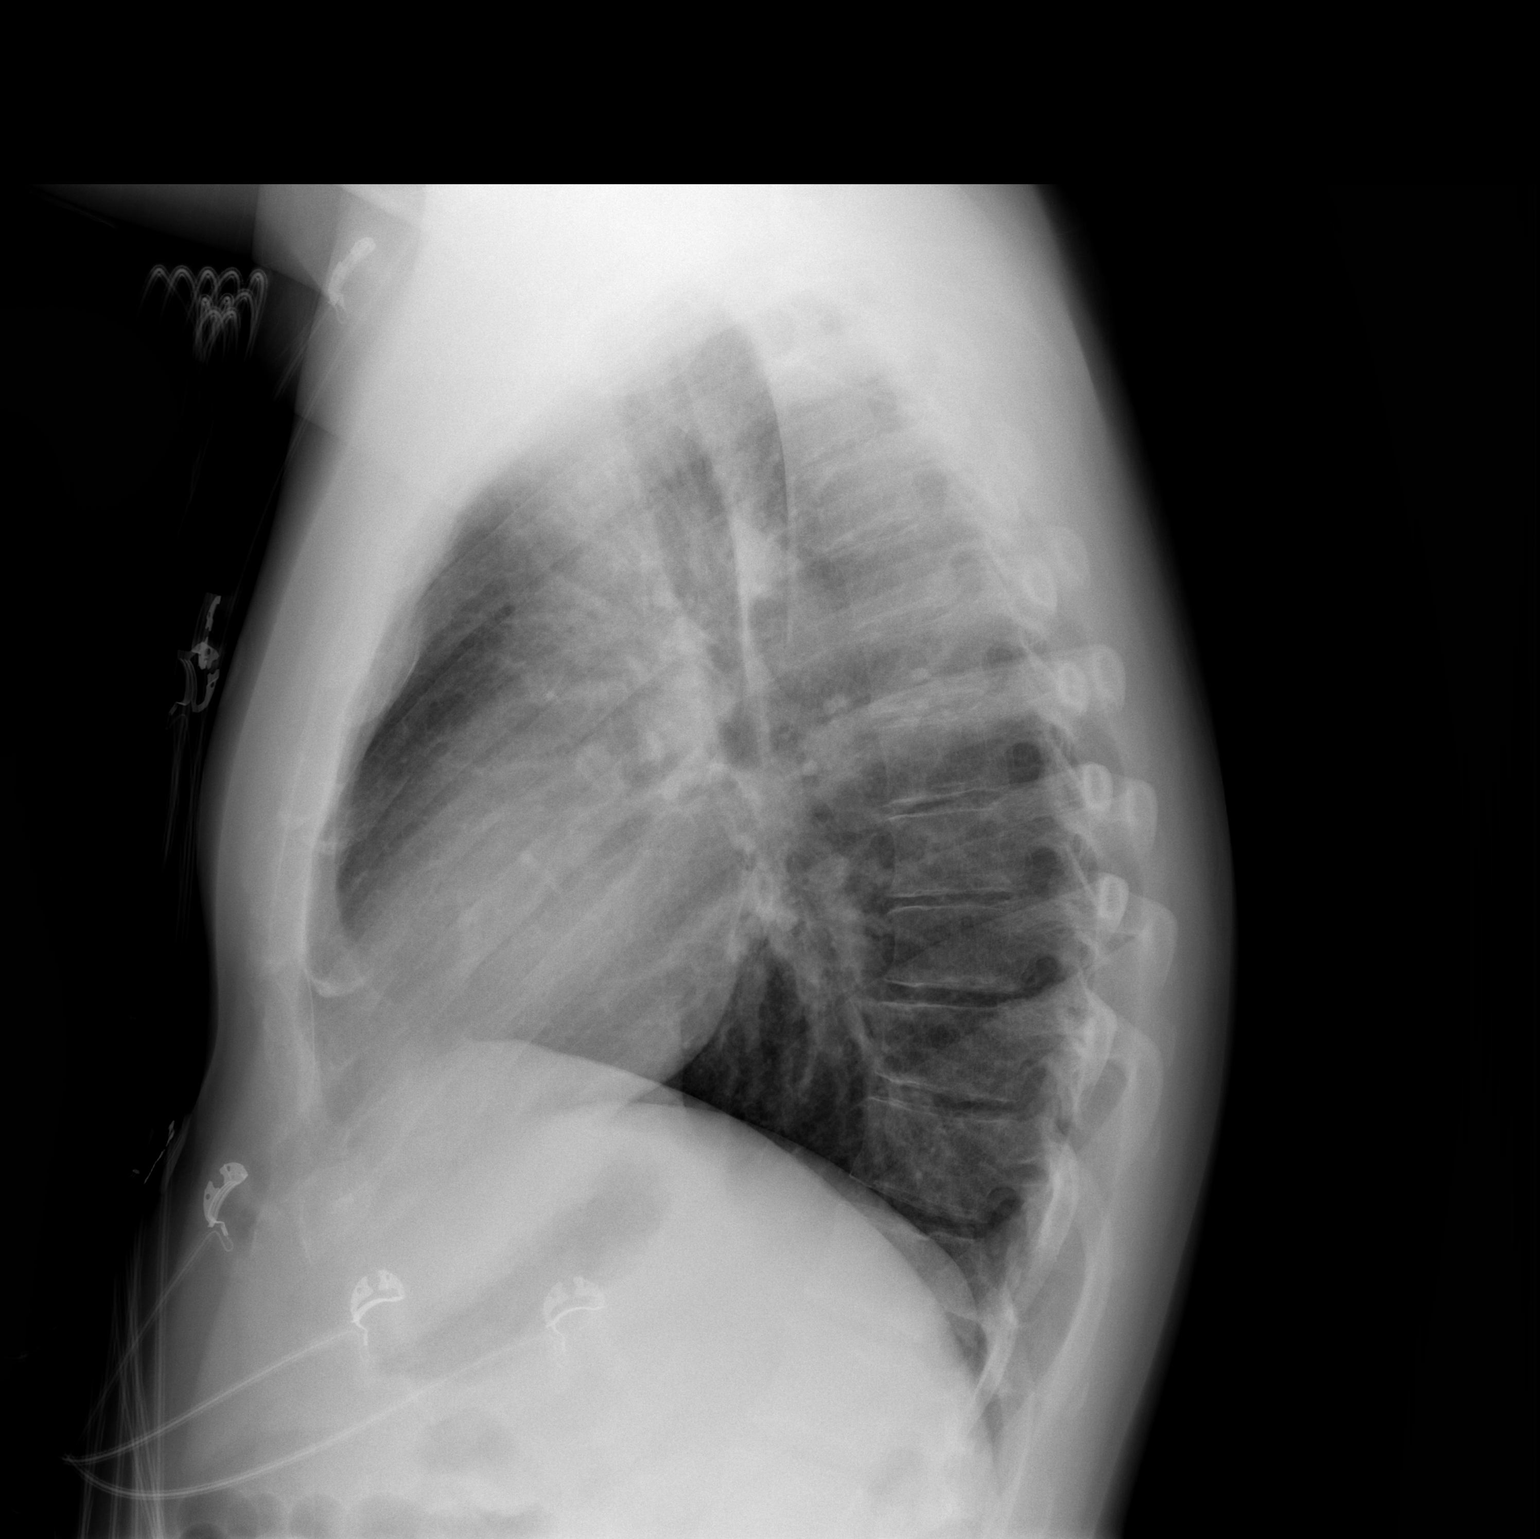

[2 of 2 positions shown; findings below may reference images not displayed]

FINDINGS: The heart size and mediastinal contours are within normal limits.
Both lungs are clear. The visualized skeletal structures are
unremarkable.
IMPRESSION: No active cardiopulmonary disease.

## 2020-07-15 IMAGING — DX DG CHEST 2V
2 series · 2 of 2 positions shown · non-contrast
Comparison: January 23, 2018

CLINICAL DATA: Chest pain

EXAM:
CHEST - 2 VIEW

[chest pa]
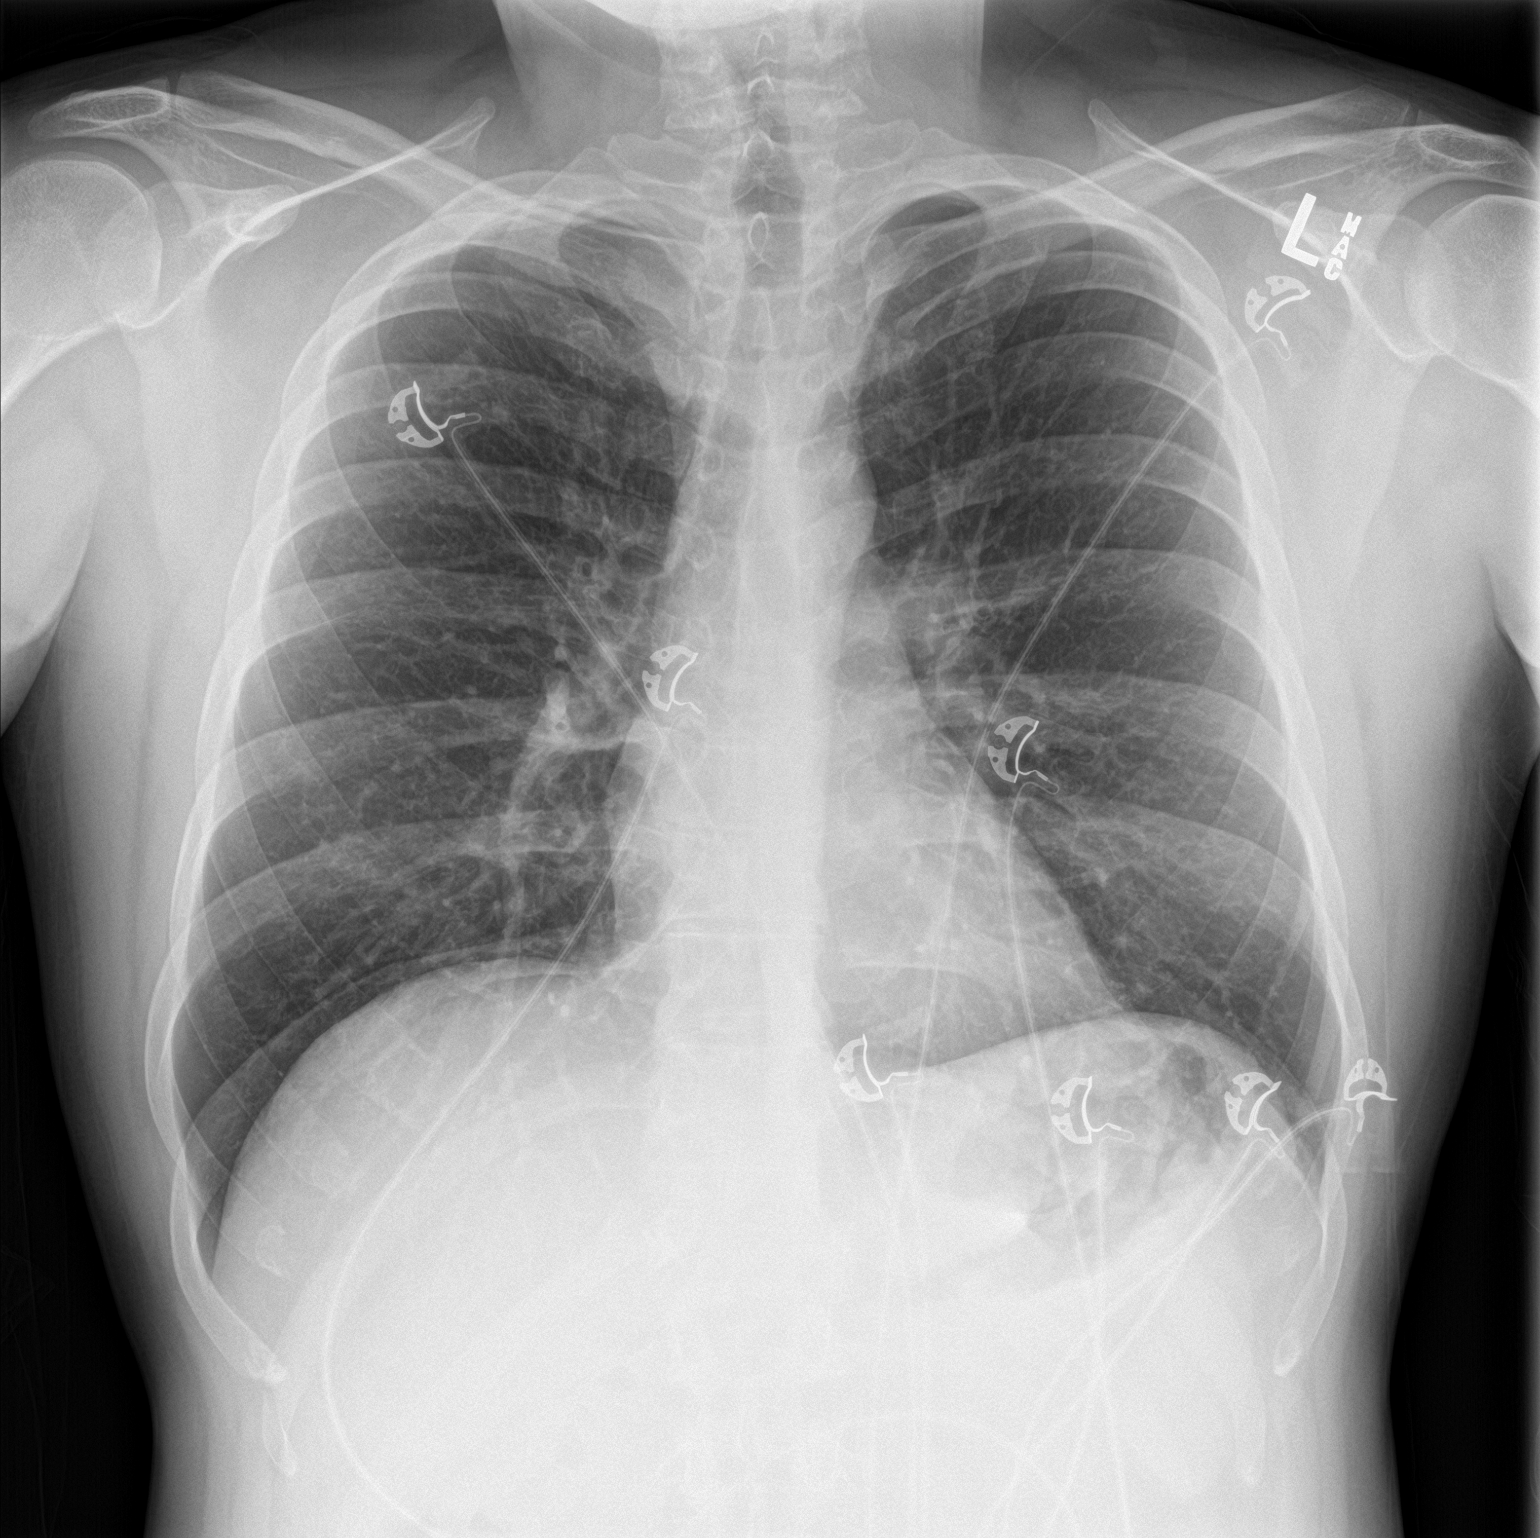

[chest lat]
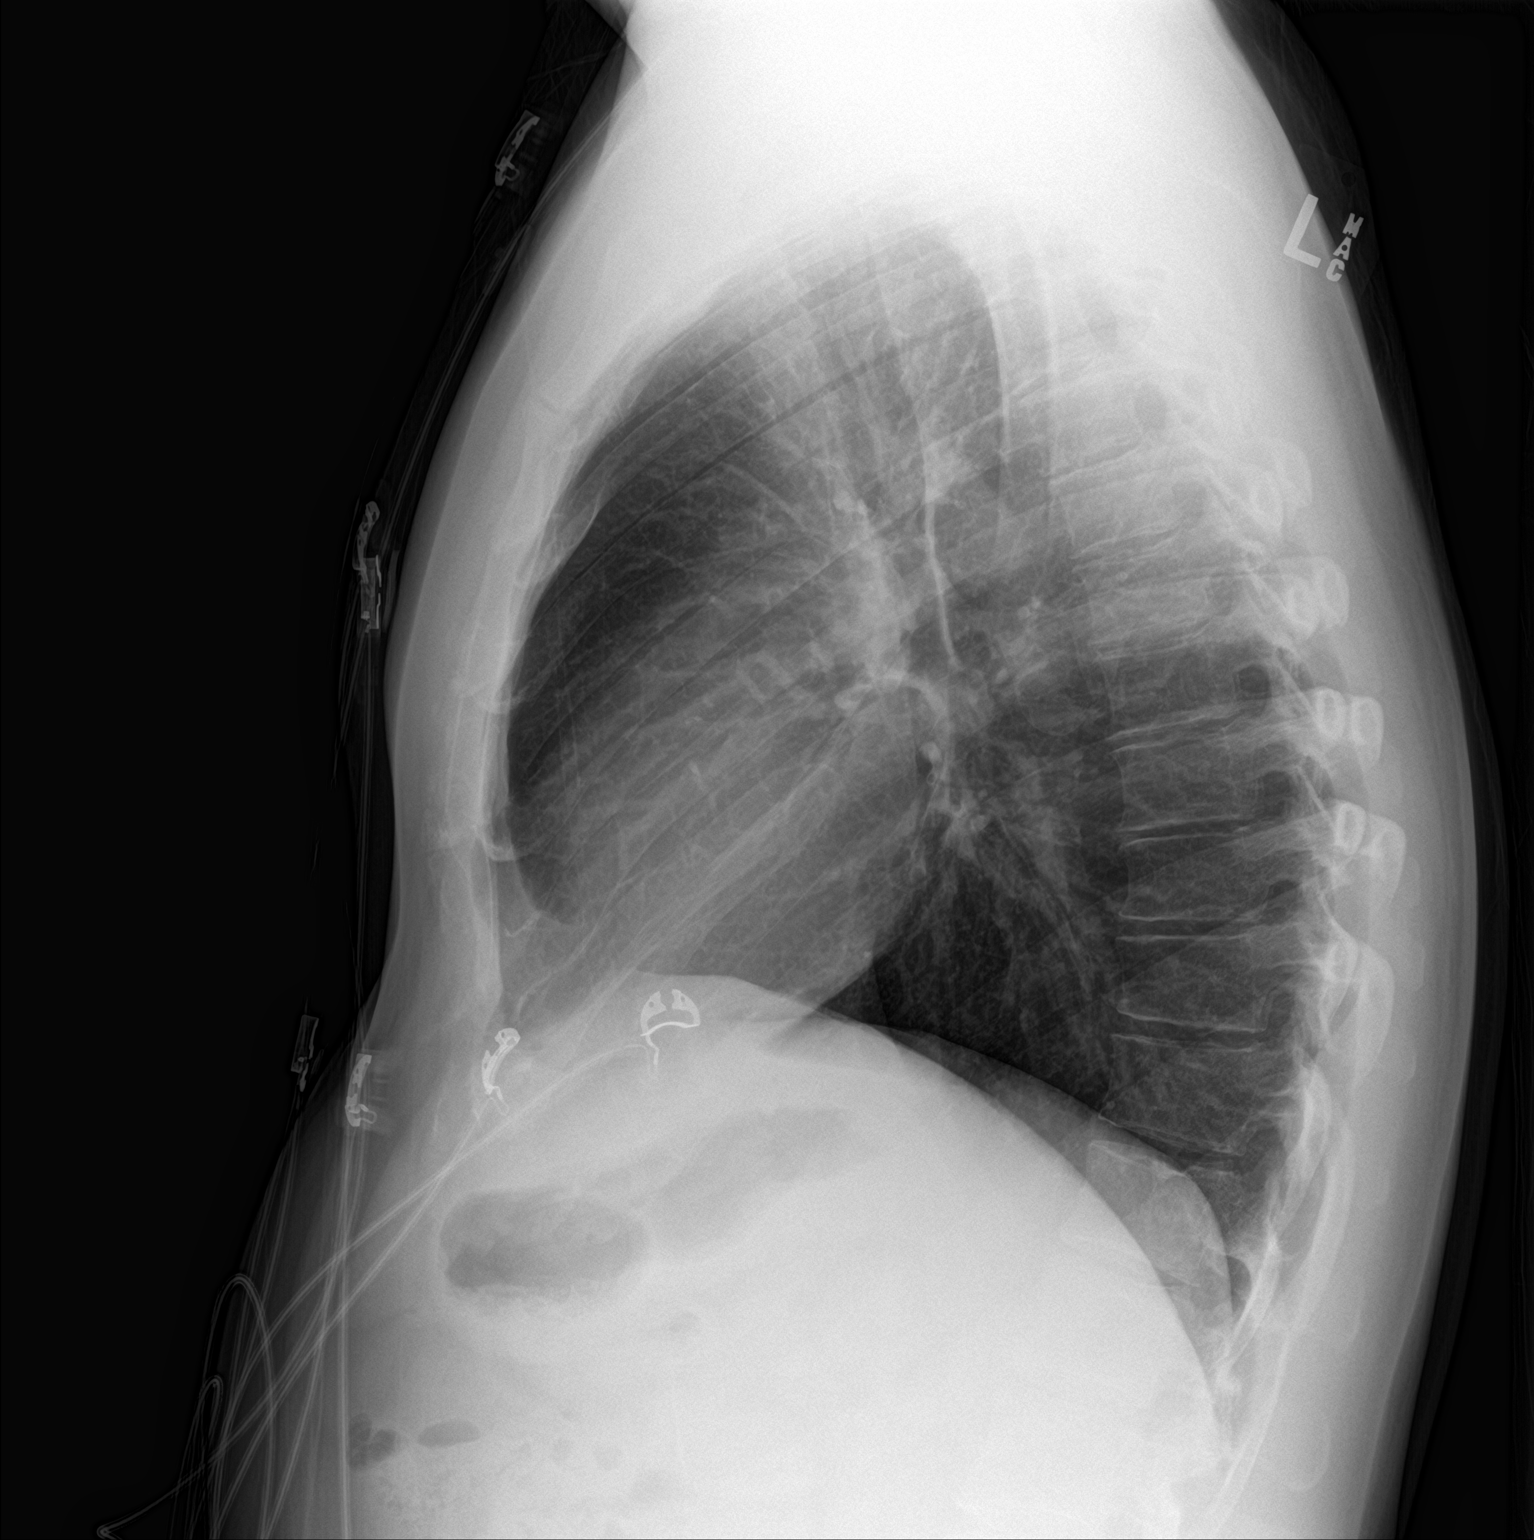

[2 of 2 positions shown; findings below may reference images not displayed]

FINDINGS: The heart size and mediastinal contours are within normal limits.
Both lungs are clear. The visualized skeletal structures are
unremarkable.
IMPRESSION: No acute cardiopulmonary process.

## 2024-04-01 ENCOUNTER — Emergency Department (HOSPITAL_COMMUNITY)
Admission: EM | Admit: 2024-04-01 | Discharge: 2024-04-01 | Disposition: A | Discharge status: Home / Self Care | Payer: PRIVATE HEALTH INSURANCE | Attending: Emergency Medicine | Admitting: Emergency Medicine

## 2024-04-01 ENCOUNTER — Other Ambulatory Visit: Payer: Self-pay

## 2024-04-01 DIAGNOSIS — F10930 Alcohol use, unspecified with withdrawal, uncomplicated: Secondary | ICD-10-CM

## 2024-04-01 DIAGNOSIS — F10230 Alcohol dependence with withdrawal, uncomplicated: Secondary | ICD-10-CM | POA: Insufficient documentation

## 2024-04-01 DIAGNOSIS — R002 Palpitations: Secondary | ICD-10-CM | POA: Insufficient documentation

## 2024-04-01 DIAGNOSIS — R251 Tremor, unspecified: Secondary | ICD-10-CM | POA: Insufficient documentation

## 2024-04-01 DIAGNOSIS — R519 Headache, unspecified: Secondary | ICD-10-CM | POA: Diagnosis not present

## 2024-04-01 LAB — URINE DRUG SCREEN
Amphetamines: NEGATIVE
Barbiturates: NEGATIVE
Benzodiazepines: NEGATIVE
Cocaine: NEGATIVE
Fentanyl: NEGATIVE
Methadone Scn, Ur: NEGATIVE
Opiates: NEGATIVE
Tetrahydrocannabinol: NEGATIVE

## 2024-04-01 LAB — COMPREHENSIVE METABOLIC PANEL WITH GFR
ALT: 32 U/L (ref 0–44)
AST: 32 U/L (ref 15–41)
Albumin: 4.7 g/dL (ref 3.5–5.0)
Alkaline Phosphatase: 71 U/L (ref 38–126)
Anion gap: 12 (ref 5–15)
BUN: 9 mg/dL (ref 6–20)
CO2: 25 mmol/L (ref 22–32)
Calcium: 9.2 mg/dL (ref 8.9–10.3)
Chloride: 99 mmol/L (ref 98–111)
Creatinine, Ser: 0.79 mg/dL (ref 0.61–1.24)
GFR, Estimated: >60 mL/min
Glucose, Bld: 111 mg/dL — ABNORMAL HIGH (ref 70–99)
Potassium: 3.6 mmol/L (ref 3.5–5.1)
Sodium: 136 mmol/L (ref 135–145)
Total Bilirubin: 1 mg/dL (ref 0.0–1.2)
Total Protein: 7.9 g/dL (ref 6.5–8.1)

## 2024-04-01 LAB — CBC
HCT: 48.7 % (ref 39.0–52.0)
Hemoglobin: 16.8 g/dL (ref 13.0–17.0)
MCH: 30.6 pg (ref 26.0–34.0)
MCHC: 34.5 g/dL (ref 30.0–36.0)
MCV: 88.7 fL (ref 80.0–100.0)
Platelets: 333 K/uL (ref 150–400)
RBC: 5.49 MIL/uL (ref 4.22–5.81)
RDW: 12.9 % (ref 11.5–15.5)
WBC: 6.5 K/uL (ref 4.0–10.5)
nRBC: 0 % (ref 0.0–0.2)

## 2024-04-01 LAB — ETHANOL: Alcohol, Ethyl (B): <15 mg/dL

## 2024-04-01 MED ORDER — CHLORDIAZEPOXIDE HCL 25 MG PO CAPS: ORAL_CAPSULE | ORAL | 0 refills | Status: DC

## 2024-04-01 MED ORDER — SODIUM CHLORIDE 0.9 % IV BOLUS
1000.0000 mL | Freq: Once | INTRAVENOUS | Status: AC
  Administered 2024-04-01: 1000 mL via INTRAVENOUS

## 2024-04-01 MED ORDER — LORAZEPAM 2 MG/ML IJ SOLN
1.0000 mg | Freq: Once | INTRAMUSCULAR | Status: AC
  Administered 2024-04-01: 1 mg via INTRAVENOUS
  Filled 2024-04-01: qty 1

## 2024-04-01 NOTE — ED Provider Notes (Signed)
 " [place] EMERGENCY DEPARTMENT AT [facility] Provider Note   CSN: [id number] Arrival date & time: 04/01/24  1334     Patient presents with: Drug / Alcohol Assessment and Withdrawal   Walter Madden is a 34 y.o. male hx of alcohol abuse here presenting with concern for alcohol withdrawal.  Patient states that he has been sober until about Thanksgiving time.  He states that he was on disulfiram at that time.  He states that he relapsed since Thanksgiving.  He states that he has been drinking 6-8 beers daily.  Patient's last drink was around 5 PM yesterday.  Patient supposed to go to [facility] for rehab next week.  Patient states that he has some palpitations and headaches and tremors.  Patient denies any other drug use.  Patient denies any history of alcohol withdrawal seizures.  Denies any hallucinations   The history is provided by the patient.       Prior to Admission medications  Medication Sig Start Date End Date Taking? Authorizing Provider  busPIRone (BUSPAR) 10 MG tablet Take 10 mg by mouth 3 (three) times daily.   Yes [provider]  cloNIDine (CATAPRES) 0.1 MG tablet Take 0.1 mg by mouth. 11/06/23 11/05/24 Yes [provider]  amLODipine (NORVASC) 5 MG tablet Take 5 mg by mouth daily.    [provider]  esomeprazole  (NEXIUM ) 40 MG capsule Take 1 capsule (40 mg total) by mouth daily. 01/02/19   Patt Alm Macho, MD    Allergies: Patient has no known allergies.    Review of Systems  Neurological:  Positive for light-headedness.  All other systems reviewed and are negative.   Updated Vital Signs BP (!) 136/102 (BP Location: Left Arm)   Pulse (!) 118   Temp 97.8 F (36.6 C) (Oral)   Resp 18   SpO2 96%   Physical Exam Vitals and nursing note reviewed.  Constitutional:      Comments: Anxious  HENT:     Head: Normocephalic.     Nose: Nose normal.     Mouth/Throat:     Mouth: Mucous membranes are dry.  Eyes:      Extraocular Movements: Extraocular movements intact.     Pupils: Pupils are equal, round, and reactive to light.  Cardiovascular:     Rate and Rhythm: Tachycardia present.     Pulses: Normal pulses.     Heart sounds: Normal heart sounds.  Pulmonary:     Effort: Pulmonary effort is normal.     Breath sounds: Normal breath sounds.  Abdominal:     General: Abdomen is flat.     Palpations: Abdomen is soft.  Musculoskeletal:        General: Normal range of motion.     Cervical back: Normal range of motion.  Skin:    General: Skin is warm.     Capillary Refill: Capillary refill takes less than 2 seconds.  Neurological:     General: No focal deficit present.     Mental Status: He is oriented to person, place, and time.  Psychiatric:        Mood and Affect: Mood normal.        Behavior: Behavior normal.     (all labs ordered are listed, but only abnormal results are displayed) Labs Reviewed  COMPREHENSIVE METABOLIC PANEL WITH GFR - Abnormal; Notable for the following components:      Result Value   Glucose, Bld 111 (*)    All other components within  normal limits  ETHANOL  CBC  URINE DRUG SCREEN    EKG: None  Radiology: No results found.   Procedures   Medications Ordered in the ED  sodium chloride  0.9 % bolus 1,000 mL (has no administration in time range)  LORazepam  (ATIVAN ) injection 1 mg (has no administration in time range)                                    Medical Decision Making Walter Madden is a 34 y.o. male here presenting with tremors and possible alcohol withdrawal.  Patient last alcohol was yesterday.  Patient is tachycardic in the ER.  Plan to get CIWA score and check electrolytes and hydrate and give Ativan  per CIWA  5:11 PM I reviewed his labs and they were unremarkable.  Alcohol level is negative.  UDS is negative.  Patient CIWA was 3.  Given IV fluids and heart rate was initially 118 and now down to 97.  Patient is feeling much better.  Will  give Librium  taper.  He will go to outpatient rehab next week  Problems Addressed: Alcohol withdrawal syndrome without complication (HCC): acute illness or injury  Amount and/or Complexity of Data Reviewed Labs: ordered.  Risk Prescription drug management.     Final diagnoses:  None    ED Discharge Orders     None          Patt Alm Macho, MD 04/01/24 1712  "

## 2024-04-01 NOTE — ED Provider Triage Note (Signed)
 Emergency Medicine Provider Triage Evaluation Note  Walter Madden , a 34 y.o. male  was evaluated in triage.  Pt complains of alcohol withdrawal.  Patient reports that he last had alcohol intake at about 5 PM yesterday.  Reports typically drinking beer as his alcohol source.  Denies any other significant alcohol consumption or any other substance use.  Review of Systems  Positive: As above Negative: As above  Physical Exam  BP (!) 136/102 (BP Location: Left Arm)   Pulse (!) 118   Temp 97.8 F (36.6 C) (Oral)   Resp 18   SpO2 96%  Gen:   Awake, no distress   Resp:  Normal effort  MSK:   Moves extremities without difficulty  Other:    Medical Decision Making  Medically screening exam initiated at 2:23 PM.  Appropriate orders placed.  Kiley Torrence was informed that the remainder of the evaluation will be completed by another provider, this initial triage assessment does not replace that evaluation, and the importance of remaining in the ED until their evaluation is complete.     Iktan Aikman A, PA-C 04/01/24 1424

## 2024-04-01 NOTE — Discharge Instructions (Addendum)
 You have mild alcohol withdrawal  Take Librium  as prescribed.  Please avoid drinking alcohol  Please go to detox next week  Return to ER if you have worse tremors or hallucinations or seizures

## 2024-04-01 NOTE — ED Triage Notes (Signed)
 PT ambulatory to triage requesting detox from alcohol.PT reports that he typically drinks 6-8 beers daily, last intake 17:00 yesterday. PT has a plan to start an outpatient detox program next week, but was told to come to the ER due to withdrawal sx.  PT feels his heart racing, headache, and a metallic taste in his mouth.   Denies hx of seizures. Denies recent recreational drug use.

## 2024-04-02 ENCOUNTER — Telehealth (HOSPITAL_COMMUNITY): Payer: Self-pay | Admitting: Emergency Medicine

## 2024-04-02 MED ORDER — CHLORDIAZEPOXIDE HCL 25 MG PO CAPS: ORAL_CAPSULE | ORAL | 0 refills | Status: AC

## 2024-04-02 NOTE — Telephone Encounter (Signed)
 Needs prescription sent to different pharmacy due to being unable to fill there.
